# Patient Record
Sex: Female | Born: 1965 | Race: Black or African American | Hispanic: No | Smoking: Never smoker
Health system: Southern US, Community
[De-identification: ages and names within clinical notes are randomized; demographics above are authoritative.]

## PROBLEM LIST (undated history)

## (undated) DIAGNOSIS — B20 Human immunodeficiency virus [HIV] disease: Secondary | ICD-10-CM

## (undated) DIAGNOSIS — Z21 Asymptomatic human immunodeficiency virus [HIV] infection status: Secondary | ICD-10-CM

## (undated) HISTORY — DX: Asymptomatic human immunodeficiency virus (hiv) infection status: Z21

## (undated) HISTORY — DX: Human immunodeficiency virus (HIV) disease: B20

---

## 1990-08-17 DIAGNOSIS — R978 Other abnormal tumor markers: Secondary | ICD-10-CM | POA: Insufficient documentation

## 2001-09-17 DIAGNOSIS — F17200 Nicotine dependence, unspecified, uncomplicated: Secondary | ICD-10-CM | POA: Insufficient documentation

## 2005-09-07 ENCOUNTER — Emergency Department: Payer: Self-pay | Admitting: Emergency Medicine

## 2009-01-06 ENCOUNTER — Ambulatory Visit: Payer: Self-pay | Admitting: Rheumatology

## 2010-11-28 ENCOUNTER — Ambulatory Visit: Payer: Self-pay | Admitting: Family Medicine

## 2010-12-05 ENCOUNTER — Ambulatory Visit: Payer: Self-pay | Admitting: Family Medicine

## 2011-01-01 ENCOUNTER — Ambulatory Visit: Payer: Self-pay | Admitting: General Surgery

## 2011-01-03 LAB — PATHOLOGY REPORT

## 2011-09-10 ENCOUNTER — Ambulatory Visit: Payer: Self-pay | Admitting: General Surgery

## 2011-09-18 ENCOUNTER — Ambulatory Visit: Payer: Self-pay | Admitting: General Surgery

## 2013-05-19 ENCOUNTER — Ambulatory Visit: Payer: Self-pay | Admitting: Family Medicine

## 2013-08-11 DIAGNOSIS — M069 Rheumatoid arthritis, unspecified: Secondary | ICD-10-CM | POA: Insufficient documentation

## 2013-08-11 DIAGNOSIS — B20 Human immunodeficiency virus [HIV] disease: Secondary | ICD-10-CM | POA: Insufficient documentation

## 2013-08-11 DIAGNOSIS — Z1389 Encounter for screening for other disorder: Secondary | ICD-10-CM | POA: Insufficient documentation

## 2013-09-11 ENCOUNTER — Ambulatory Visit: Payer: Self-pay | Admitting: Family Medicine

## 2014-05-24 ENCOUNTER — Encounter: Payer: Self-pay | Admitting: Podiatry

## 2014-05-24 ENCOUNTER — Ambulatory Visit (INDEPENDENT_AMBULATORY_CARE_PROVIDER_SITE_OTHER): Payer: BC Managed Care – PPO

## 2014-05-24 ENCOUNTER — Ambulatory Visit (INDEPENDENT_AMBULATORY_CARE_PROVIDER_SITE_OTHER): Payer: BC Managed Care – PPO | Admitting: Podiatry

## 2014-05-24 VITALS — BP 117/67 | HR 60 | Resp 16 | Ht 63.0 in | Wt 172.0 lb

## 2014-05-24 DIAGNOSIS — S90129A Contusion of unspecified lesser toe(s) without damage to nail, initial encounter: Secondary | ICD-10-CM

## 2014-05-24 DIAGNOSIS — S90121A Contusion of right lesser toe(s) without damage to nail, initial encounter: Secondary | ICD-10-CM

## 2014-05-24 DIAGNOSIS — L600 Ingrowing nail: Secondary | ICD-10-CM

## 2014-05-24 MED ORDER — NEOMYCIN-POLYMYXIN-HC 3.5-10000-1 OT SOLN: OTIC | Status: DC

## 2014-05-24 NOTE — Patient Instructions (Signed)

## 2014-05-24 NOTE — Progress Notes (Signed)
   Subjective:    Patient ID: Alexandra Vaughan, female    DOB: Nov 23, 1965, 48 y.o.   MRN: [id number]  HPI Comments: i want the big toenail on my right foot taken off but i want it to grow back. i cracked it last week. Its been black and blue for a while. It does not hurt it unless i bump it on something. i keep hitting my toe. i pulled my cart at work over it yesterday. The nail is getting worse. i have pedicures on my toes. i only keep it clean and i trimmed it down some and i put band aids on my toe.      Review of Systems  HENT: Positive for sinus pressure.   Cardiovascular: Positive for leg swelling.  Hematological: Bruises/bleeds easily.  All other systems reviewed and are negative.      Objective:   Physical Exam: I have reviewed her past medical history medications allergies surgeries social history. She is HIV-positive. Pulses are palpable bilateral. Neurologic sensorium is intact per Semmes-Weinstein monofilament. Degenerative flexor intact bilateral muscle strength is 5 over 5 dorsiflexors plantar flexors inverters everters on his musculature is intact. Orthopedic evaluation demonstrates all joints distal to the ankle a full range of motion without crepitation. Cutaneous evaluation Mr. is supple well hydrated cutis there is no erythema edema cellulitis drainage or odor. She has thick yellow dystrophic clinically mycotic nail to the hallux right. This may be nail dystrophy. We are unsure at this point. But it is painful to touch.        Assessment & Plan:  Assessment: Nail dystrophy possible onychomycosis hallux right.  Plan: Total nail avulsion was performed today after 3 cc of a 50-50 mixture of Marcaine plain lidocaine plain was infiltrated in a hallux block right. He was totally avulsed and a dry sterile compressive dressing was applied. She was her soaking twice daily in Betadine and water starting tomorrow. The nail was avulsed and sent for pathologic evaluation.

## 2014-06-07 ENCOUNTER — Ambulatory Visit (INDEPENDENT_AMBULATORY_CARE_PROVIDER_SITE_OTHER): Payer: BC Managed Care – PPO | Admitting: Podiatry

## 2014-06-07 VITALS — BP 223/205 | HR 76 | Resp 16

## 2014-06-07 DIAGNOSIS — S90121A Contusion of right lesser toe(s) without damage to nail, initial encounter: Secondary | ICD-10-CM

## 2014-06-08 NOTE — Progress Notes (Signed)
She presents today for followup of a total nail avulsion hallux right. She denies fever chills nausea vomiting muscle aches and pains. States she's been soaking in vinegar and water.  Objective: Vital signs are stable she is alert and oriented x3. No erythema edema cellulitis drainage or odor.  Assessment: Well-healing nail avulsion hallux right.  Plan: Continue to soak but in Epsom salts in warm water until completely healed.

## 2016-03-23 DIAGNOSIS — Z6832 Body mass index (BMI) 32.0-32.9, adult: Secondary | ICD-10-CM | POA: Diagnosis not present

## 2016-03-23 DIAGNOSIS — B20 Human immunodeficiency virus [HIV] disease: Secondary | ICD-10-CM | POA: Diagnosis not present

## 2016-03-23 DIAGNOSIS — M069 Rheumatoid arthritis, unspecified: Secondary | ICD-10-CM | POA: Diagnosis not present

## 2016-07-02 DIAGNOSIS — R110 Nausea: Secondary | ICD-10-CM | POA: Diagnosis not present

## 2016-07-02 DIAGNOSIS — M7989 Other specified soft tissue disorders: Secondary | ICD-10-CM | POA: Diagnosis not present

## 2016-07-02 DIAGNOSIS — M25571 Pain in right ankle and joints of right foot: Secondary | ICD-10-CM | POA: Diagnosis not present

## 2016-07-02 DIAGNOSIS — Z6834 Body mass index (BMI) 34.0-34.9, adult: Secondary | ICD-10-CM | POA: Diagnosis not present

## 2016-07-02 DIAGNOSIS — M069 Rheumatoid arthritis, unspecified: Secondary | ICD-10-CM | POA: Diagnosis not present

## 2016-07-02 DIAGNOSIS — Z21 Asymptomatic human immunodeficiency virus [HIV] infection status: Secondary | ICD-10-CM | POA: Diagnosis not present

## 2016-07-02 DIAGNOSIS — M19031 Primary osteoarthritis, right wrist: Secondary | ICD-10-CM | POA: Diagnosis not present

## 2016-07-02 DIAGNOSIS — M25532 Pain in left wrist: Secondary | ICD-10-CM | POA: Diagnosis not present

## 2016-07-02 DIAGNOSIS — M0600 Rheumatoid arthritis without rheumatoid factor, unspecified site: Secondary | ICD-10-CM | POA: Diagnosis not present

## 2016-07-02 DIAGNOSIS — M25572 Pain in left ankle and joints of left foot: Secondary | ICD-10-CM | POA: Diagnosis not present

## 2016-07-02 DIAGNOSIS — Z7952 Long term (current) use of systemic steroids: Secondary | ICD-10-CM | POA: Diagnosis not present

## 2016-07-14 DIAGNOSIS — R030 Elevated blood-pressure reading, without diagnosis of hypertension: Secondary | ICD-10-CM | POA: Diagnosis not present

## 2016-07-14 DIAGNOSIS — Z6834 Body mass index (BMI) 34.0-34.9, adult: Secondary | ICD-10-CM | POA: Diagnosis not present

## 2016-08-02 DIAGNOSIS — E669 Obesity, unspecified: Secondary | ICD-10-CM | POA: Diagnosis not present

## 2016-08-02 DIAGNOSIS — Z6834 Body mass index (BMI) 34.0-34.9, adult: Secondary | ICD-10-CM | POA: Diagnosis not present

## 2016-08-21 DIAGNOSIS — D509 Iron deficiency anemia, unspecified: Secondary | ICD-10-CM | POA: Diagnosis not present

## 2016-08-21 DIAGNOSIS — Z6835 Body mass index (BMI) 35.0-35.9, adult: Secondary | ICD-10-CM | POA: Diagnosis not present

## 2016-08-21 DIAGNOSIS — B20 Human immunodeficiency virus [HIV] disease: Secondary | ICD-10-CM | POA: Diagnosis not present

## 2016-08-21 DIAGNOSIS — M069 Rheumatoid arthritis, unspecified: Secondary | ICD-10-CM | POA: Diagnosis not present

## 2016-10-08 DIAGNOSIS — M25531 Pain in right wrist: Secondary | ICD-10-CM | POA: Diagnosis not present

## 2016-10-08 DIAGNOSIS — D899 Disorder involving the immune mechanism, unspecified: Secondary | ICD-10-CM | POA: Diagnosis not present

## 2016-10-08 DIAGNOSIS — M25571 Pain in right ankle and joints of right foot: Secondary | ICD-10-CM | POA: Diagnosis not present

## 2016-10-08 DIAGNOSIS — Z79899 Other long term (current) drug therapy: Secondary | ICD-10-CM | POA: Diagnosis not present

## 2016-10-08 DIAGNOSIS — Z6836 Body mass index (BMI) 36.0-36.9, adult: Secondary | ICD-10-CM | POA: Diagnosis not present

## 2016-10-08 DIAGNOSIS — M0609 Rheumatoid arthritis without rheumatoid factor, multiple sites: Secondary | ICD-10-CM | POA: Diagnosis not present

## 2016-10-08 DIAGNOSIS — M25532 Pain in left wrist: Secondary | ICD-10-CM | POA: Diagnosis not present

## 2016-10-08 DIAGNOSIS — M25572 Pain in left ankle and joints of left foot: Secondary | ICD-10-CM | POA: Diagnosis not present

## 2016-10-08 DIAGNOSIS — M7989 Other specified soft tissue disorders: Secondary | ICD-10-CM | POA: Diagnosis not present

## 2016-10-08 DIAGNOSIS — Z21 Asymptomatic human immunodeficiency virus [HIV] infection status: Secondary | ICD-10-CM | POA: Diagnosis not present

## 2017-02-18 ENCOUNTER — Ambulatory Visit: Payer: Self-pay | Admitting: Podiatry

## 2017-02-18 ENCOUNTER — Encounter: Payer: Self-pay | Admitting: Podiatry

## 2017-02-18 ENCOUNTER — Ambulatory Visit (INDEPENDENT_AMBULATORY_CARE_PROVIDER_SITE_OTHER): Payer: BLUE CROSS/BLUE SHIELD | Admitting: Podiatry

## 2017-02-18 ENCOUNTER — Ambulatory Visit (INDEPENDENT_AMBULATORY_CARE_PROVIDER_SITE_OTHER): Payer: BLUE CROSS/BLUE SHIELD

## 2017-02-18 DIAGNOSIS — Z6836 Body mass index (BMI) 36.0-36.9, adult: Secondary | ICD-10-CM | POA: Diagnosis not present

## 2017-02-18 DIAGNOSIS — Z79899 Other long term (current) drug therapy: Secondary | ICD-10-CM | POA: Diagnosis not present

## 2017-02-18 DIAGNOSIS — M0600 Rheumatoid arthritis without rheumatoid factor, unspecified site: Secondary | ICD-10-CM | POA: Diagnosis not present

## 2017-02-18 DIAGNOSIS — M779 Enthesopathy, unspecified: Principal | ICD-10-CM

## 2017-02-18 DIAGNOSIS — M7750 Other enthesopathy of unspecified foot: Secondary | ICD-10-CM | POA: Diagnosis not present

## 2017-02-18 DIAGNOSIS — B20 Human immunodeficiency virus [HIV] disease: Secondary | ICD-10-CM | POA: Diagnosis not present

## 2017-02-18 DIAGNOSIS — R635 Abnormal weight gain: Secondary | ICD-10-CM | POA: Diagnosis not present

## 2017-02-18 DIAGNOSIS — L603 Nail dystrophy: Secondary | ICD-10-CM | POA: Diagnosis not present

## 2017-02-18 DIAGNOSIS — A499 Bacterial infection, unspecified: Secondary | ICD-10-CM | POA: Diagnosis not present

## 2017-02-18 DIAGNOSIS — M778 Other enthesopathies, not elsewhere classified: Secondary | ICD-10-CM

## 2017-02-18 DIAGNOSIS — L608 Other nail disorders: Secondary | ICD-10-CM | POA: Diagnosis not present

## 2017-02-18 NOTE — Progress Notes (Signed)
She presents today for a concern regarding her thick toenails bilateral.  Objective: Vital signs are stable she is alert and oriented 3 pulses are palpable neurologic sensorium is intact. Mallet toe deformity hammertoe deformities are noted. She has what appears to be subungual onychomycosis possible nail dystrophy.  Assessment: Nail dystrophy cannot rule out onychomycosis.  Plan: Samples of her hallux nails are taken today to be sent for pathologic evaluation when noted from her of the results once samples return.

## 2017-02-27 ENCOUNTER — Encounter: Payer: Self-pay | Admitting: Podiatry

## 2017-03-04 ENCOUNTER — Telehealth: Payer: Self-pay

## 2017-03-04 NOTE — Telephone Encounter (Signed)
Attempted to call pt to inform her of negative fungal culture results, line was continuously busy

## 2017-04-01 ENCOUNTER — Encounter: Payer: Self-pay | Admitting: Podiatry

## 2017-04-01 ENCOUNTER — Ambulatory Visit (INDEPENDENT_AMBULATORY_CARE_PROVIDER_SITE_OTHER): Payer: BLUE CROSS/BLUE SHIELD | Admitting: Podiatry

## 2017-04-01 DIAGNOSIS — L603 Nail dystrophy: Secondary | ICD-10-CM | POA: Diagnosis not present

## 2017-04-01 MED ORDER — NEOMYCIN-POLYMYXIN-HC 3.5-10000-1 OT SOLN: OTIC | 3 refills | Status: DC

## 2017-04-01 NOTE — Progress Notes (Signed)
She presents today for follow-up of her onychomycosis of the pathology report.  Objective: Pathology report states bacterial colonization no fungus T's or mold.  Assessment: Nail dystrophy.  Plan: Wrote a prescription for Cortisporin otic to be applied to the distal aspect of the free edge of the nail. Follow up with her as needed.

## 2017-04-09 DIAGNOSIS — Z1231 Encounter for screening mammogram for malignant neoplasm of breast: Secondary | ICD-10-CM | POA: Diagnosis not present

## 2017-04-09 DIAGNOSIS — N926 Irregular menstruation, unspecified: Secondary | ICD-10-CM | POA: Diagnosis not present

## 2017-04-09 DIAGNOSIS — B20 Human immunodeficiency virus [HIV] disease: Secondary | ICD-10-CM | POA: Diagnosis not present

## 2017-04-09 DIAGNOSIS — Z6836 Body mass index (BMI) 36.0-36.9, adult: Secondary | ICD-10-CM | POA: Diagnosis not present

## 2017-06-10 DIAGNOSIS — Z7952 Long term (current) use of systemic steroids: Secondary | ICD-10-CM | POA: Diagnosis not present

## 2017-06-10 DIAGNOSIS — M79641 Pain in right hand: Secondary | ICD-10-CM | POA: Diagnosis not present

## 2017-06-10 DIAGNOSIS — Z21 Asymptomatic human immunodeficiency virus [HIV] infection status: Secondary | ICD-10-CM | POA: Diagnosis not present

## 2017-06-10 DIAGNOSIS — Z23 Encounter for immunization: Secondary | ICD-10-CM | POA: Diagnosis not present

## 2017-06-10 DIAGNOSIS — M25532 Pain in left wrist: Secondary | ICD-10-CM | POA: Diagnosis not present

## 2017-06-10 DIAGNOSIS — Z6836 Body mass index (BMI) 36.0-36.9, adult: Secondary | ICD-10-CM | POA: Diagnosis not present

## 2017-06-10 DIAGNOSIS — M0579 Rheumatoid arthritis with rheumatoid factor of multiple sites without organ or systems involvement: Secondary | ICD-10-CM | POA: Diagnosis not present

## 2017-06-10 DIAGNOSIS — M779 Enthesopathy, unspecified: Secondary | ICD-10-CM | POA: Diagnosis not present

## 2017-06-10 DIAGNOSIS — Z79899 Other long term (current) drug therapy: Secondary | ICD-10-CM | POA: Diagnosis not present

## 2017-06-10 DIAGNOSIS — M25531 Pain in right wrist: Secondary | ICD-10-CM | POA: Diagnosis not present

## 2017-06-13 DIAGNOSIS — Z87891 Personal history of nicotine dependence: Secondary | ICD-10-CM | POA: Diagnosis not present

## 2017-06-13 DIAGNOSIS — R200 Anesthesia of skin: Secondary | ICD-10-CM | POA: Diagnosis not present

## 2017-06-13 DIAGNOSIS — M069 Rheumatoid arthritis, unspecified: Secondary | ICD-10-CM | POA: Diagnosis not present

## 2017-06-13 DIAGNOSIS — M79641 Pain in right hand: Secondary | ICD-10-CM | POA: Diagnosis not present

## 2017-06-13 DIAGNOSIS — G5600 Carpal tunnel syndrome, unspecified upper limb: Secondary | ICD-10-CM | POA: Diagnosis not present

## 2017-06-13 DIAGNOSIS — M79642 Pain in left hand: Secondary | ICD-10-CM | POA: Diagnosis not present

## 2017-06-13 DIAGNOSIS — R202 Paresthesia of skin: Secondary | ICD-10-CM | POA: Diagnosis not present

## 2017-06-13 DIAGNOSIS — M19041 Primary osteoarthritis, right hand: Secondary | ICD-10-CM | POA: Diagnosis not present

## 2017-06-13 DIAGNOSIS — Z9289 Personal history of other medical treatment: Secondary | ICD-10-CM | POA: Diagnosis not present

## 2017-06-13 DIAGNOSIS — M0579 Rheumatoid arthritis with rheumatoid factor of multiple sites without organ or systems involvement: Secondary | ICD-10-CM | POA: Diagnosis not present

## 2017-06-13 DIAGNOSIS — G5603 Carpal tunnel syndrome, bilateral upper limbs: Secondary | ICD-10-CM | POA: Diagnosis not present

## 2017-07-02 DIAGNOSIS — G5602 Carpal tunnel syndrome, left upper limb: Secondary | ICD-10-CM | POA: Diagnosis not present

## 2017-07-02 DIAGNOSIS — Z1231 Encounter for screening mammogram for malignant neoplasm of breast: Secondary | ICD-10-CM | POA: Diagnosis not present

## 2017-07-02 DIAGNOSIS — M79642 Pain in left hand: Secondary | ICD-10-CM | POA: Diagnosis not present

## 2017-07-02 DIAGNOSIS — M79641 Pain in right hand: Secondary | ICD-10-CM | POA: Diagnosis not present

## 2017-07-15 DIAGNOSIS — R928 Other abnormal and inconclusive findings on diagnostic imaging of breast: Secondary | ICD-10-CM | POA: Diagnosis not present

## 2017-07-15 DIAGNOSIS — M19031 Primary osteoarthritis, right wrist: Secondary | ICD-10-CM | POA: Diagnosis not present

## 2017-08-06 DIAGNOSIS — M19031 Primary osteoarthritis, right wrist: Secondary | ICD-10-CM | POA: Diagnosis not present

## 2017-08-06 DIAGNOSIS — Z79899 Other long term (current) drug therapy: Secondary | ICD-10-CM | POA: Diagnosis not present

## 2017-08-06 DIAGNOSIS — M069 Rheumatoid arthritis, unspecified: Secondary | ICD-10-CM | POA: Diagnosis not present

## 2017-08-06 DIAGNOSIS — Z87891 Personal history of nicotine dependence: Secondary | ICD-10-CM | POA: Diagnosis not present

## 2017-08-06 DIAGNOSIS — G5601 Carpal tunnel syndrome, right upper limb: Secondary | ICD-10-CM | POA: Diagnosis not present

## 2017-08-06 DIAGNOSIS — G8918 Other acute postprocedural pain: Secondary | ICD-10-CM | POA: Diagnosis not present

## 2017-08-06 DIAGNOSIS — Z21 Asymptomatic human immunodeficiency virus [HIV] infection status: Secondary | ICD-10-CM | POA: Diagnosis not present

## 2017-08-06 HISTORY — PX: JOINT REPLACEMENT: SHX530

## 2017-08-19 DIAGNOSIS — Z9889 Other specified postprocedural states: Secondary | ICD-10-CM | POA: Diagnosis not present

## 2017-08-19 DIAGNOSIS — M7989 Other specified soft tissue disorders: Secondary | ICD-10-CM | POA: Diagnosis not present

## 2017-09-10 DIAGNOSIS — Z6838 Body mass index (BMI) 38.0-38.9, adult: Secondary | ICD-10-CM | POA: Diagnosis not present

## 2017-09-10 DIAGNOSIS — Z113 Encounter for screening for infections with a predominantly sexual mode of transmission: Secondary | ICD-10-CM | POA: Diagnosis not present

## 2017-09-10 DIAGNOSIS — B20 Human immunodeficiency virus [HIV] disease: Secondary | ICD-10-CM | POA: Diagnosis not present

## 2017-09-10 DIAGNOSIS — Z1211 Encounter for screening for malignant neoplasm of colon: Secondary | ICD-10-CM | POA: Diagnosis not present

## 2017-09-16 ENCOUNTER — Emergency Department
Admission: EM | Admit: 2017-09-16 | Discharge: 2017-09-17 | Disposition: A | Discharge status: Home / Self Care | Payer: BLUE CROSS/BLUE SHIELD | Attending: Emergency Medicine | Admitting: Emergency Medicine

## 2017-09-16 ENCOUNTER — Emergency Department: Payer: BLUE CROSS/BLUE SHIELD

## 2017-09-16 DIAGNOSIS — M069 Rheumatoid arthritis, unspecified: Secondary | ICD-10-CM | POA: Diagnosis not present

## 2017-09-16 DIAGNOSIS — Z79899 Other long term (current) drug therapy: Secondary | ICD-10-CM | POA: Insufficient documentation

## 2017-09-16 DIAGNOSIS — R1032 Left lower quadrant pain: Secondary | ICD-10-CM | POA: Insufficient documentation

## 2017-09-16 DIAGNOSIS — B20 Human immunodeficiency virus [HIV] disease: Secondary | ICD-10-CM | POA: Insufficient documentation

## 2017-09-16 DIAGNOSIS — Z96649 Presence of unspecified artificial hip joint: Secondary | ICD-10-CM | POA: Insufficient documentation

## 2017-09-16 DIAGNOSIS — M19031 Primary osteoarthritis, right wrist: Secondary | ICD-10-CM | POA: Diagnosis not present

## 2017-09-16 DIAGNOSIS — Z981 Arthrodesis status: Secondary | ICD-10-CM | POA: Diagnosis not present

## 2017-09-16 DIAGNOSIS — R109 Unspecified abdominal pain: Secondary | ICD-10-CM | POA: Diagnosis not present

## 2017-09-16 LAB — COMPREHENSIVE METABOLIC PANEL
ALBUMIN: 3.7 g/dL (ref 3.5–5.0)
ALT: 17 U/L (ref 14–54)
AST: 27 U/L (ref 15–41)
Alkaline Phosphatase: 59 U/L (ref 38–126)
Anion gap: 7 (ref 5–15)
BILIRUBIN TOTAL: 0.6 mg/dL (ref 0.3–1.2)
BUN: 12 mg/dL (ref 6–20)
CHLORIDE: 104 mmol/L (ref 101–111)
CO2: 25 mmol/L (ref 22–32)
CREATININE: 0.87 mg/dL (ref 0.44–1.00)
Calcium: 8.9 mg/dL (ref 8.9–10.3)
GFR calc Af Amer: >60 mL/min (ref >60)
GLUCOSE: 81 mg/dL (ref 65–99)
Potassium: 4.5 mmol/L (ref 3.5–5.1)
Sodium: 136 mmol/L (ref 135–145)
TOTAL PROTEIN: 7.9 g/dL (ref 6.5–8.1)

## 2017-09-16 LAB — LIPASE, BLOOD: Lipase: 31 U/L (ref 11–51)

## 2017-09-16 LAB — URINALYSIS, COMPLETE (UACMP) WITH MICROSCOPIC
BACTERIA UA: NONE SEEN
Bilirubin Urine: NEGATIVE
GLUCOSE, UA: NEGATIVE mg/dL
HGB URINE DIPSTICK: NEGATIVE
KETONES UR: NEGATIVE mg/dL
LEUKOCYTES UA: NEGATIVE
Nitrite: NEGATIVE
PROTEIN: NEGATIVE mg/dL
Specific Gravity, Urine: 1.018 (ref 1.005–1.030)
pH: 6 (ref 5.0–8.0)

## 2017-09-16 LAB — CBC
HEMATOCRIT: 34.2 % — AB (ref 35.0–47.0)
Hemoglobin: 11.5 g/dL — ABNORMAL LOW (ref 12.0–16.0)
MCH: 29.9 pg (ref 26.0–34.0)
MCHC: 33.6 g/dL (ref 32.0–36.0)
MCV: 88.8 fL (ref 80.0–100.0)
PLATELETS: 220 10*3/uL (ref 150–440)
RBC: 3.85 MIL/uL (ref 3.80–5.20)
RDW: 15.3 % — ABNORMAL HIGH (ref 11.5–14.5)
WBC: 7.1 10*3/uL (ref 3.6–11.0)

## 2017-09-16 MED ORDER — ONDANSETRON HCL 4 MG/2ML IJ SOLN
4.0000 mg | Freq: Once | INTRAMUSCULAR | Status: AC
  Administered 2017-09-16: 4 mg via INTRAVENOUS

## 2017-09-16 MED ORDER — SODIUM CHLORIDE 0.9 % IV BOLUS (SEPSIS)
500.0000 mL | Freq: Once | INTRAVENOUS | Status: AC
  Administered 2017-09-16: 500 mL via INTRAVENOUS

## 2017-09-16 MED ORDER — FENTANYL CITRATE (PF) 100 MCG/2ML IJ SOLN: 50.0000 ug | INTRAMUSCULAR | Status: DC | PRN

## 2017-09-16 MED ORDER — MORPHINE SULFATE (PF) 4 MG/ML IV SOLN
4.0000 mg | Freq: Once | INTRAVENOUS | Status: AC
  Administered 2017-09-16: 4 mg via INTRAVENOUS

## 2017-09-16 MED ORDER — ONDANSETRON HCL 4 MG/2ML IJ SOLN
INTRAMUSCULAR | Status: AC
  Administered 2017-09-16: 4 mg via INTRAVENOUS
  Filled 2017-09-16: qty 2

## 2017-09-16 MED ORDER — MORPHINE SULFATE (PF) 4 MG/ML IV SOLN
INTRAVENOUS | Status: AC
  Administered 2017-09-16: 4 mg via INTRAVENOUS
  Filled 2017-09-16: qty 1

## 2017-09-16 NOTE — ED Provider Notes (Signed)
[facility] Emergency Department Provider Note  ____________________________________________  Time seen: Approximately 10:53 PM  I have reviewed the triage vital signs and the nursing notes.   HISTORY  Chief Complaint Abdominal Pain   HPI Alexandra Vaughan is a 52 y.o. female the history of HIV (CD4 count of 873, undetectable viral load on 09/11/17) who presents for evaluation of abdominal pain. Patient reports sudden onset of severe left-sided abdominal pain, sharp, coming in waves, constant for several hours. She has nausea but no vomiting, no diarrhea or constipation, no dysuria or hematuria. Patient reports having a similar episode several years ago with a negative workup. She denies any prior history of kidney stones. No fever or chills. No URI symptoms. No chest pain or shortness of breath.  Past Medical History:  Diagnosis Date  . HIV (human immunodeficiency virus infection) [facility])     Patient Active Problem List   Diagnosis Date Noted  . Obesity 08/02/2016  . Multiphasic screening 08/11/2013  . HIV disease (HCC) 08/11/2013  . Rheumatoid arthritis (HCC) 08/11/2013  . Tobacco use disorder 09/17/2001  . Abnormal tumor markers 08/17/1990    Past Surgical History:  Procedure Laterality Date  . JOINT REPLACEMENT      Prior to Admission medications   Medication Sig Start Date End Date Taking? Authorizing Provider  atazanavir (REYATAZ) 300 MG capsule Take 300 mg by mouth. 09/08/13   [provider]  Calcium Carb-Cholecalciferol 600-800 MG-UNIT CHEW Chew by mouth. 03/02/13   [provider]  darunavir (PREZISTA) 800 MG tablet Take 800 mg by mouth. 08/21/16   [provider]  emtricitabine-tenofovir (TRUVADA) 200-300 MG per tablet Take by mouth. 09/08/13   [provider]  emtricitabine-tenofovir AF (DESCOVY) 200-25 MG tablet Take by mouth. 08/21/16   [provider]  etanercept (ENBREL) 50 MG/ML injection  Inject 50 mg into the skin. 07/05/16   [provider]  Ferrous Fumarate (FERROCITE) 324 (106 Fe) MG TABS tablet TAKE 1 TABLET BY MOUTH EVERY DAY AT 6:00AM 07/11/15   [provider]  hydroxychloroquine (PLAQUENIL) 200 MG tablet TAKE 1 TABLET BY MOUTH TWICE A DAY 12/03/13   [provider]  neomycin-polymyxin-hydrocortisone (CORTISPORIN) otic solution Apply one to two drops to affected toe(s) twice daily after soaking 05/24/14   Hyatt, Max T, DPM  neomycin-polymyxin-hydrocortisone (CORTISPORIN) OTIC solution Apply one drop to all affected nails along the free edge of the nail. 04/01/17   Hyatt, Max T, DPM  predniSONE (DELTASONE) 10 MG tablet Take 10 mg by mouth. 09/08/13   [provider]  ritonavir (NORVIR) 100 MG TABS tablet Take 100 mg by mouth. 09/08/13   [provider]    Allergies Percocet [oxycodone-acetaminophen] and Ciprofloxacin  No family history on file.  Social History Social History   Tobacco Use  . Smoking status: Never Smoker  . Smokeless tobacco: Never Used  Substance Use Topics  . Alcohol use: No  . Drug use: Not on file    Review of Systems  Constitutional: Negative for fever. Eyes: Negative for visual changes. ENT: Negative for sore throat. Neck: No neck pain  Cardiovascular: Negative for chest pain. Respiratory: Negative for shortness of breath. Gastrointestinal: + L sided abdominal pain and nausea. No vomiting or diarrhea. Genitourinary: Negative for dysuria. Musculoskeletal: Negative for back pain. Skin: Negative for rash. Neurological: Negative for headaches, weakness or numbness. Psych: No SI or HI  ____________________________________________   PHYSICAL EXAM:  VITAL SIGNS: ED Triage Vitals  Enc Vitals Group  BP 09/16/17 2147 (!) 144/70     Pulse Rate 09/16/17 2147 71     Resp 09/16/17 2147 (!) 26     Temp 09/16/17 2147 98.8 F (37.1 C)     Temp Source 09/16/17 2147 Oral     SpO2 09/16/17 2147 100 %       Weight 09/16/17 2148 208 lb (94.3 kg)     Height --      Head Circumference --      Peak Flow --      Pain Score 09/16/17 2147 10     Pain Loc --      Pain Edu? --      Excl. in GC? --     Constitutional: Alert and oriented, in distress due to pain.  HEENT:      Head: Normocephalic and atraumatic.         Eyes: Conjunctivae are normal. Sclera is non-icteric.       Mouth/Throat: Mucous membranes are moist.       Neck: Supple with no signs of meningismus. Cardiovascular: Regular rate and rhythm. No murmurs, gallops, or rubs. 2+ symmetrical distal pulses are present in all extremities. No JVD. Respiratory: Normal respiratory effort. Lungs are clear to auscultation bilaterally. No wheezes, crackles, or rhonchi.  Gastrointestinal: Soft, tenderness to palpation on the left lower quadrant and suprapubic region, non distended with positive bowel sounds. No rebound or guarding. Genitourinary: No CVA tenderness. Musculoskeletal: Nontender with normal range of motion in all extremities. No edema, cyanosis, or erythema of extremities. Neurologic: Normal speech and language. Face is symmetric. Moving all extremities. No gross focal neurologic deficits are appreciated. Skin: Skin is warm, dry and intact. No rash noted. Psychiatric: Mood and affect are normal. Speech and behavior are normal.  ____________________________________________   LABS (all labs ordered are listed, but only abnormal results are displayed)  Labs Reviewed  CBC - Abnormal; Notable for the following components:      Result Value   Hemoglobin 11.5 (*)    HCT 34.2 (*)    RDW 15.3 (*)    All other components within normal limits  LIPASE, BLOOD  COMPREHENSIVE METABOLIC PANEL  URINALYSIS, COMPLETE (UACMP) WITH MICROSCOPIC  HCG, QUANTITATIVE, PREGNANCY  POC URINE PREG, ED   ____________________________________________  EKG  none  ____________________________________________  RADIOLOGY  CT renal: PND   ____________________________________________   PROCEDURES  Procedure(s) performed: None Procedures Critical Care performed:  None ____________________________________________   INITIAL IMPRESSION / ASSESSMENT AND PLAN / ED COURSE  52 y.o. female the history of HIV (CD4 count of 873, undetectable viral load on 09/11/17) who presents for evaluation of sudden onset of severe L sided abdominal pain. Patient is in distress due to pain, vitals are within normal limits, she is tender to palpation on the left lower quadrant and suprapubic regions with no rebound or guarding, no CVA tenderness. Differential diagnoses including kidney stone versus peptic ulcer disease versus diverticulitis versus pyelonephritis versus gastritis versus volvulus versus pancreatitis. Labs are pending. Patient will be given morphine, Zofran and fluids. We'll send her for CT abdomen and pelvis without contrast.  Clinical Course as of Sep 17 2347  Mon Sep 16, 2017  2348 CT pending. Labs with no acute findings. UA pending. Care transferred to Dr. Manson Passey. Plan to f/u UA, CT and reassess.  [CV]    Clinical Course User Index [CV] Don Perking Washington, MD     As part of my medical decision making, I reviewed the following data within the  electronic MEDICAL RECORD NUMBER Nursing notes reviewed and incorporated, Labs reviewed , Patient signed out to Dr. Manson Passey, Notes from prior ED visits and [place] Controlled Substance Database    Pertinent labs & imaging results that were available during my care of the patient were reviewed by me and considered in my medical decision making (see chart for details).    ____________________________________________   FINAL CLINICAL IMPRESSION(S) / ED DIAGNOSES  Final diagnoses:  Left lower quadrant pain      NEW MEDICATIONS STARTED DURING THIS VISIT:  ED Discharge Orders    None       Note:  This document was prepared using Dragon voice recognition software and may include  unintentional dictation errors.    Nita Sickle, MD 09/16/17 [phone]

## 2017-09-16 NOTE — ED Triage Notes (Signed)
Patient c/o lower abdominal pain rated 10 of 10. Patient reports nausea, denies vomiting and diarrhea.

## 2017-09-17 ENCOUNTER — Emergency Department: Payer: BLUE CROSS/BLUE SHIELD

## 2017-09-17 DIAGNOSIS — R109 Unspecified abdominal pain: Secondary | ICD-10-CM | POA: Diagnosis not present

## 2017-09-17 LAB — HCG, QUANTITATIVE, PREGNANCY: hCG, Beta Chain, Quant, S: 1 m[IU]/mL (ref <5)

## 2017-09-17 MED ORDER — TRAMADOL HCL 50 MG PO TABS
100.0000 mg | ORAL_TABLET | Freq: Once | ORAL | Status: AC
  Administered 2017-09-17: 100 mg via ORAL
  Filled 2017-09-17: qty 2

## 2017-09-17 MED ORDER — TRAMADOL HCL 50 MG PO TABS: 50.0000 mg | ORAL_TABLET | Freq: Four times a day (QID) | ORAL | 0 refills | Status: AC | PRN

## 2017-09-17 NOTE — ED Provider Notes (Signed)
I assumed care of the patient from Dr. Michell Heinrich at 12:00 AM with recommendation to follow-up with CT scan of the abdomen renal study which revealed no evidence of urolithiasis possible uterine fibroid noted.  Patient advised to follow-up with OB/GYN   Darci Current, MD 09/17/17 [phone]

## 2017-09-20 ENCOUNTER — Encounter: Payer: Self-pay | Admitting: Obstetrics and Gynecology

## 2017-09-20 ENCOUNTER — Ambulatory Visit (INDEPENDENT_AMBULATORY_CARE_PROVIDER_SITE_OTHER): Payer: BLUE CROSS/BLUE SHIELD | Admitting: Obstetrics and Gynecology

## 2017-09-20 VITALS — BP 122/66 | HR 59 | Ht 63.0 in | Wt 221.0 lb

## 2017-09-20 DIAGNOSIS — Z113 Encounter for screening for infections with a predominantly sexual mode of transmission: Secondary | ICD-10-CM | POA: Diagnosis not present

## 2017-09-20 DIAGNOSIS — Z124 Encounter for screening for malignant neoplasm of cervix: Secondary | ICD-10-CM

## 2017-09-20 DIAGNOSIS — R102 Pelvic and perineal pain: Secondary | ICD-10-CM

## 2017-09-20 DIAGNOSIS — R978 Other abnormal tumor markers: Secondary | ICD-10-CM

## 2017-09-20 NOTE — Patient Instructions (Signed)
Pelvic Pain, Female Pelvic pain is pain in your lower abdomen, below your belly button and between your hips. The pain may start suddenly (acute), keep coming back (recurring), or last a long time (chronic). Pelvic pain that lasts longer than six months is considered chronic. Pelvic pain may affect your:  Reproductive organs.  Urinary system.  Digestive tract.  Musculoskeletal system.  There are many potential causes of pelvic pain. Sometimes, the pain can be a result of digestive or urinary conditions, strained muscles or ligaments, or even reproductive conditions. Sometimes the cause of pelvic pain is not known. Follow these instructions at home:  Take over-the-counter and prescription medicines only as told by your health care provider.  Rest as told by your health care provider.  Do not have sex it if hurts.  Keep a journal of your pelvic pain. Write down: ? When the pain started. ? Where the pain is located. ? What seems to make the pain better or worse, such as food or your menstrual cycle. ? Any symptoms you have along with the pain.  Keep all follow-up visits as told by your health care provider. This is important. Contact a health care provider if:  Medicine does not help your pain.  Your pain comes back.  You have new symptoms.  You have abnormal vaginal discharge or bleeding, including bleeding after menopause.  You have a fever or chills.  You are constipated.  You have blood in your urine or stool.  You have foul-smelling urine.  You feel weak or lightheaded. Get help right away if:  You have sudden severe pain.  Your pain gets steadily worse.  You have severe pain along with fever, nausea, vomiting, or excessive sweating.  You lose consciousness. This information is not intended to replace advice given to you by your health care provider. Make sure you discuss any questions you have with your health care provider. Document Released: 07/17/2004  Document Revised: 09/14/2015 Document Reviewed: 06/10/2015 Elsevier Interactive Patient Education  2018 [employer]vier Inc[employer]  

## 2017-09-20 NOTE — Progress Notes (Signed)
Gynecology Pelvic Pain Evaluation   Chief Complaint:  Chief Complaint  Patient presents with  . ER follow up    Lower abdominal pain    History of Present Illness:   Patient is a 52 y.o. G4P4 who LMP was Patient's last menstrual period was 08/06/2017 (exact date)., presents today for a problem visit.  She complains of midline pelvic pain.  On initial presentation more prominent on left.   Her pain is localized to the suprapubic and deep pelvis area, described as constant, began 3 days ago and its severity is described as initially severe and acute onset but has steadily improved since ER presentation on 09/16/17.. The pain radiates to the  Non-radiating. She has these associated symptoms which include none. Patient has these modifiers which include pain medication and lying down that make it better and movement that make it worse.  Context includes: no inciting event noted by the patient.  She skipped her last menses.  Previously reported regular monthly menses lasting 4-5 days, not particularly heavy, no prior issues with dysmenorrhea.  She is not and has not been sexually active in sometime so unable to relate any dyspareunia.  Denies vaginal discharge.  She does have a history of HIV diagnosed early 33's, states viral load has been undetectable.  No recent changes in medications.  Previous evaluation: normal CT imaging in ER, small 83mm fibroid. Prior Diagnosis: fibroid   Review of Systems: Review of Systems  Constitutional: Negative for chills and fever.  Gastrointestinal: Positive for abdominal pain. Negative for blood in stool, constipation, diarrhea, melena, nausea and vomiting.  Genitourinary: Negative for dysuria, frequency, hematuria and urgency.  Skin: Negative.   Neurological: Negative for dizziness and headaches.    Past Medical History:  Past Medical History:  Diagnosis Date  . HIV (human immunodeficiency virus infection) (HCC)     Past Surgical History:  Past  Surgical History:  Procedure Laterality Date  . JOINT REPLACEMENT  08/06/2017    Gynecologic History:  Patient's last menstrual period was 08/06/2017 (exact date). She is not sexually active.  Contraception: none. Hx of STDs: HIV. History of domestic or sexual abuse:  no Last Pap:>1 year ago  Obstetric History: G71P4  Family History:  Family History  Problem Relation Age of Onset  . Breast cancer Maternal Aunt   . Breast cancer Maternal Grandmother     Social History:  Social History   Socioeconomic History  . Marital status: Widowed    Spouse name: Not on file  . Number of children: Not on file  . Years of education: Not on file  . Highest education level: Not on file  Social Needs  . Financial resource strain: Not on file  . Food insecurity - worry: Not on file  . Food insecurity - inability: Not on file  . Transportation needs - medical: Not on file  . Transportation needs - non-medical: Not on file  Occupational History  . Not on file  Tobacco Use  . Smoking status: Never Smoker  . Smokeless tobacco: Never Used  Substance and Sexual Activity  . Alcohol use: No  . Drug use: No  . Sexual activity: Not Currently    Birth control/protection: None  Other Topics Concern  . Not on file  Social History Narrative  . Not on file    Allergies:  Allergies  Allergen Reactions  . Percocet [Oxycodone-Acetaminophen] Nausea Only  . Ciprofloxacin Palpitations    Medications: Prior to Admission medications   Medication  Sig Start Date End Date Taking? Authorizing Provider  Adalimumab 40 MG/0.8ML PNKT Inject 40 mg into the skin. 09/10/17  Yes [provider]  darunavir (PREZISTA) 800 MG tablet Take 800 mg by mouth. 08/21/16  Yes [provider]  emtricitabine-tenofovir AF (DESCOVY) 200-25 MG tablet Take by mouth. 08/21/16  Yes [provider]  neomycin-polymyxin-hydrocortisone (CORTISPORIN) OTIC solution Apply one drop to all affected nails  along the free edge of the nail. 04/01/17  Yes Hyatt, Max T, DPM  ritonavir (NORVIR) 100 MG TABS tablet Take 100 mg by mouth. 09/08/13  Yes [provider]  traMADol (ULTRAM) 50 MG tablet Take 1 tablet (50 mg total) by mouth every 6 (six) hours as needed. 09/17/17 09/17/18 Yes Darci Current, MD  triamcinolone cream (KENALOG) 0.1 % APPLY TO AFFECTED AREA TWICE A DAY 07/19/17  Yes [provider]  atazanavir (REYATAZ) 300 MG capsule Take 300 mg by mouth. 09/08/13   [provider]  Calcium Carb-Cholecalciferol 600-800 MG-UNIT CHEW Chew by mouth. 03/02/13   [provider]  emtricitabine-tenofovir (TRUVADA) 200-300 MG per tablet Take by mouth. 09/08/13   [provider]  etanercept (ENBREL) 50 MG/ML injection Inject 50 mg into the skin. 07/05/16   [provider]  Ferrous Fumarate (FERROCITE) 324 (106 Fe) MG TABS tablet TAKE 1 TABLET BY MOUTH EVERY DAY AT 6:00AM 07/11/15   [provider]  hydroxychloroquine (PLAQUENIL) 200 MG tablet TAKE 1 TABLET BY MOUTH TWICE A DAY 12/03/13   [provider]  neomycin-polymyxin-hydrocortisone (CORTISPORIN) otic solution Apply one to two drops to affected toe(s) twice daily after soaking Patient not taking: Reported on 09/20/2017 05/24/14   Hyatt, Max T, DPM  predniSONE (DELTASONE) 10 MG tablet Take 10 mg by mouth. 09/08/13   [provider]    Physical Exam Vitals: Blood pressure 122/66, pulse (!) 59, height 5\' 3"  (1.6 m), weight 221 lb (100.2 kg), last menstrual period 08/06/2017.  General: NAD HEENT: normocephalic, anicteric Pulmonary: No increased work of breathing Abdomen: NABS, soft, non-tender, non-distended.  Umbilicus without lesions.  No hepatomegaly, splenomegaly or masses palpable. No evidence of hernia  Genitourinary:  External: Normal external female genitalia.  Normal urethral meatus, normal  Bartholin's and Skene's glands.    Vagina: Normal vaginal mucosa, no evidence of  prolapse.    Cervix: Grossly normal in appearance, no bleeding  Uterus: Non-enlarged, mobile, normal contour.  No CMT  Adnexa: ovaries non-enlarged, no adnexal masses  Rectal: deferred  Lymphatic: no evidence of inguinal lymphadenopathy Extremities: no edema, erythema, or tenderness Neurologic: Grossly intact Psychiatric: mood appropriate, affect full  Female chaperone present for pelvic portion of the physical   Ct Renal Stone Study  Result Date: 09/17/2017 CLINICAL DATA:  52 year old female with abdominal pain. Sudden onset left-sided pain. EXAM: CT ABDOMEN AND PELVIS WITHOUT CONTRAST TECHNIQUE: Multidetector CT imaging of the abdomen and pelvis was performed following the standard protocol without IV contrast. COMPARISON:  Abdominal CT dated 05/19/2013 FINDINGS: Evaluation of this exam is limited in the absence of intravenous contrast. Lower chest: The visualized lung bases are clear. There is hypoattenuation of the cardiac blood pool suggestive of a degree of anemia. Clinical correlation is recommended. No intra-abdominal free air or free fluid. Hepatobiliary: The liver is unremarkable. The gallbladder is contracted. Pancreas: Unremarkable. No pancreatic ductal dilatation or surrounding inflammatory changes. Spleen: Normal in size without focal abnormality. Adrenals/Urinary Tract: Adrenal glands are unremarkable. Kidneys are normal, without renal calculi, focal lesion, or hydronephrosis. Bladder is unremarkable. Stomach/Bowel:  Stomach is within normal limits. Appendix appears normal. No evidence of bowel wall thickening, distention, or inflammatory changes. Vascular/Lymphatic: Mild atherosclerotic calcification of the aorta. The abdominal aorta and IVC are otherwise grossly unremarkable on this noncontrast CT. No portal venous gas. There is no adenopathy. Reproductive: The uterus is anteverted. Faint 12 mm low attenuating lesion in the posterior myometrium (sagittal series 6, image 117) is not well  characterized but may represent a fibroid. The ovaries are grossly unremarkable as visualized. Other: Small fat containing umbilical hernia. Musculoskeletal: No acute or significant osseous findings. IMPRESSION: 1. No acute intra-abdominal or pelvic pathology. No bowel obstruction or active inflammation. Normal appendix. 2. No hydronephrosis or nephrolithiasis. 3.  Aortic Atherosclerosis (ICD10-I70.0). Electronically Signed   By: Elgie Collard M.D.   On: 09/17/2017 02:24   Assessment: 52 y.o. G4P4 with acute onset pelvic pain  Problem List Items Addressed This Visit      Other   Abnormal tumor markers    Other Visit Diagnoses    Pelvic pain in female    -  Primary   Relevant Orders   PapIG, CtNgTv, HPV, rfx 16/18   Cervical cancer screening       Relevant Orders   PapIG, CtNgTv, HPV, rfx 16/18   Screen for STD (sexually transmitted disease)       Relevant Orders   PapIG, CtNgTv, HPV, rfx 16/18       We discussed the possible etiologies for pelvic pain in women.  Gynecologic causes may include endometriosis, adenomyosis, pelvic inflammatory disease (PID), ovarian cysts, ovarian or tubal torsion, and in rare case gynecologic malignancy such as cervical, uterine, or ovarian cancer.  In addition thee possibility of non-gynecologic etiologies such as urinary or GI tract pathology or disordered, as well as musculoskeletal problems.  The goal is to complete a basic work up in hopes of identifying the underlying cause which in turn will dictate treatment.  In the meantime supportive measures such as localized heat, and NSAIDs are reasonable first steps.     - Prescription drug database was not reviewed, UDS was not ordered - Transvaginal ultrasound not ordered but prior imaging CT/MRI reviewed, a small 32mm uterine fibroid should not account for the patient's symptoms - Blood work obtained today No, ER labs reviewed - Cervical cultures Yes with pap - UA reviewed  - Improved since ER  expectant management at present if worsens follow up sooner.  Also no menses for last 2 months, no vasomotor symptoms.  If not resolved we discussed trial of norethindrone vs diagnostic laparoscopy.  Overall I have low suspicion for a gyn etiology such as endometriosis or PID  Return in about 6 weeks (around 11/01/2017) for F/U pelvic pain.

## 2017-09-24 LAB — PAPIG, CTNGTV, HPV, RFX 16/18
Chlamydia, Nuc. Acid Amp: NEGATIVE
Gonococcus, Nuc. Acid Amp: NEGATIVE
HPV, HIGH-RISK: NEGATIVE
PAP SMEAR COMMENT: 0
TRICH VAG BY NAA: NEGATIVE

## 2017-10-03 DIAGNOSIS — M19031 Primary osteoarthritis, right wrist: Secondary | ICD-10-CM | POA: Diagnosis not present

## 2017-10-03 DIAGNOSIS — R200 Anesthesia of skin: Secondary | ICD-10-CM | POA: Diagnosis not present

## 2017-10-03 DIAGNOSIS — Z981 Arthrodesis status: Secondary | ICD-10-CM | POA: Diagnosis not present

## 2017-10-03 DIAGNOSIS — Z4789 Encounter for other orthopedic aftercare: Secondary | ICD-10-CM | POA: Diagnosis not present

## 2017-10-03 DIAGNOSIS — M25639 Stiffness of unspecified wrist, not elsewhere classified: Secondary | ICD-10-CM | POA: Diagnosis not present

## 2017-10-03 DIAGNOSIS — R202 Paresthesia of skin: Secondary | ICD-10-CM | POA: Diagnosis not present

## 2017-10-10 DIAGNOSIS — R202 Paresthesia of skin: Secondary | ICD-10-CM | POA: Diagnosis not present

## 2017-10-10 DIAGNOSIS — Z4789 Encounter for other orthopedic aftercare: Secondary | ICD-10-CM | POA: Diagnosis not present

## 2017-10-10 DIAGNOSIS — M19031 Primary osteoarthritis, right wrist: Secondary | ICD-10-CM | POA: Diagnosis not present

## 2017-10-10 DIAGNOSIS — R200 Anesthesia of skin: Secondary | ICD-10-CM | POA: Diagnosis not present

## 2017-10-10 DIAGNOSIS — Z981 Arthrodesis status: Secondary | ICD-10-CM | POA: Diagnosis not present

## 2017-10-15 DIAGNOSIS — R200 Anesthesia of skin: Secondary | ICD-10-CM | POA: Diagnosis not present

## 2017-10-15 DIAGNOSIS — M19031 Primary osteoarthritis, right wrist: Secondary | ICD-10-CM | POA: Diagnosis not present

## 2017-10-15 DIAGNOSIS — Z981 Arthrodesis status: Secondary | ICD-10-CM | POA: Diagnosis not present

## 2017-10-15 DIAGNOSIS — Z4789 Encounter for other orthopedic aftercare: Secondary | ICD-10-CM | POA: Diagnosis not present

## 2017-10-15 DIAGNOSIS — R202 Paresthesia of skin: Secondary | ICD-10-CM | POA: Diagnosis not present

## 2017-10-17 DIAGNOSIS — R202 Paresthesia of skin: Secondary | ICD-10-CM | POA: Diagnosis not present

## 2017-10-17 DIAGNOSIS — Z4789 Encounter for other orthopedic aftercare: Secondary | ICD-10-CM | POA: Diagnosis not present

## 2017-10-17 DIAGNOSIS — M19031 Primary osteoarthritis, right wrist: Secondary | ICD-10-CM | POA: Diagnosis not present

## 2017-10-17 DIAGNOSIS — R200 Anesthesia of skin: Secondary | ICD-10-CM | POA: Diagnosis not present

## 2017-10-17 DIAGNOSIS — Z981 Arthrodesis status: Secondary | ICD-10-CM | POA: Diagnosis not present

## 2017-10-25 DIAGNOSIS — M19031 Primary osteoarthritis, right wrist: Secondary | ICD-10-CM | POA: Diagnosis not present

## 2017-10-25 DIAGNOSIS — Z4789 Encounter for other orthopedic aftercare: Secondary | ICD-10-CM | POA: Diagnosis not present

## 2017-10-25 DIAGNOSIS — R200 Anesthesia of skin: Secondary | ICD-10-CM | POA: Diagnosis not present

## 2017-10-25 DIAGNOSIS — R202 Paresthesia of skin: Secondary | ICD-10-CM | POA: Diagnosis not present

## 2017-10-25 DIAGNOSIS — Z981 Arthrodesis status: Secondary | ICD-10-CM | POA: Diagnosis not present

## 2017-10-31 DIAGNOSIS — Z981 Arthrodesis status: Secondary | ICD-10-CM | POA: Diagnosis not present

## 2017-10-31 DIAGNOSIS — R200 Anesthesia of skin: Secondary | ICD-10-CM | POA: Diagnosis not present

## 2017-10-31 DIAGNOSIS — Z4789 Encounter for other orthopedic aftercare: Secondary | ICD-10-CM | POA: Diagnosis not present

## 2017-10-31 DIAGNOSIS — R202 Paresthesia of skin: Secondary | ICD-10-CM | POA: Diagnosis not present

## 2017-10-31 DIAGNOSIS — M19031 Primary osteoarthritis, right wrist: Secondary | ICD-10-CM | POA: Diagnosis not present

## 2017-11-01 ENCOUNTER — Ambulatory Visit: Payer: BLUE CROSS/BLUE SHIELD | Admitting: Obstetrics and Gynecology

## 2017-11-07 DIAGNOSIS — R200 Anesthesia of skin: Secondary | ICD-10-CM | POA: Diagnosis not present

## 2017-11-07 DIAGNOSIS — Z4789 Encounter for other orthopedic aftercare: Secondary | ICD-10-CM | POA: Diagnosis not present

## 2017-11-07 DIAGNOSIS — Z981 Arthrodesis status: Secondary | ICD-10-CM | POA: Diagnosis not present

## 2017-11-07 DIAGNOSIS — M19031 Primary osteoarthritis, right wrist: Secondary | ICD-10-CM | POA: Diagnosis not present

## 2017-11-07 DIAGNOSIS — R202 Paresthesia of skin: Secondary | ICD-10-CM | POA: Diagnosis not present

## 2017-11-11 DIAGNOSIS — Z79899 Other long term (current) drug therapy: Secondary | ICD-10-CM | POA: Diagnosis not present

## 2017-11-11 DIAGNOSIS — M069 Rheumatoid arthritis, unspecified: Secondary | ICD-10-CM | POA: Diagnosis not present

## 2017-11-11 DIAGNOSIS — Z6839 Body mass index (BMI) 39.0-39.9, adult: Secondary | ICD-10-CM | POA: Diagnosis not present

## 2017-11-11 DIAGNOSIS — Z7952 Long term (current) use of systemic steroids: Secondary | ICD-10-CM | POA: Diagnosis not present

## 2017-11-11 DIAGNOSIS — Z21 Asymptomatic human immunodeficiency virus [HIV] infection status: Secondary | ICD-10-CM | POA: Diagnosis not present

## 2017-11-14 DIAGNOSIS — R200 Anesthesia of skin: Secondary | ICD-10-CM | POA: Diagnosis not present

## 2017-11-14 DIAGNOSIS — Z981 Arthrodesis status: Secondary | ICD-10-CM | POA: Diagnosis not present

## 2017-11-14 DIAGNOSIS — R202 Paresthesia of skin: Secondary | ICD-10-CM | POA: Diagnosis not present

## 2017-11-14 DIAGNOSIS — Z4789 Encounter for other orthopedic aftercare: Secondary | ICD-10-CM | POA: Diagnosis not present

## 2017-11-14 DIAGNOSIS — M19031 Primary osteoarthritis, right wrist: Secondary | ICD-10-CM | POA: Diagnosis not present

## 2017-11-28 DIAGNOSIS — R200 Anesthesia of skin: Secondary | ICD-10-CM | POA: Diagnosis not present

## 2017-11-28 DIAGNOSIS — R202 Paresthesia of skin: Secondary | ICD-10-CM | POA: Diagnosis not present

## 2017-11-28 DIAGNOSIS — Z981 Arthrodesis status: Secondary | ICD-10-CM | POA: Diagnosis not present

## 2017-11-28 DIAGNOSIS — Z4789 Encounter for other orthopedic aftercare: Secondary | ICD-10-CM | POA: Diagnosis not present

## 2017-11-28 DIAGNOSIS — M19031 Primary osteoarthritis, right wrist: Secondary | ICD-10-CM | POA: Diagnosis not present

## 2017-12-05 DIAGNOSIS — Z981 Arthrodesis status: Secondary | ICD-10-CM | POA: Diagnosis not present

## 2017-12-05 DIAGNOSIS — R202 Paresthesia of skin: Secondary | ICD-10-CM | POA: Diagnosis not present

## 2017-12-05 DIAGNOSIS — R200 Anesthesia of skin: Secondary | ICD-10-CM | POA: Diagnosis not present

## 2017-12-05 DIAGNOSIS — M19031 Primary osteoarthritis, right wrist: Secondary | ICD-10-CM | POA: Diagnosis not present

## 2017-12-05 DIAGNOSIS — Z4789 Encounter for other orthopedic aftercare: Secondary | ICD-10-CM | POA: Diagnosis not present

## 2017-12-30 DIAGNOSIS — M19031 Primary osteoarthritis, right wrist: Secondary | ICD-10-CM | POA: Diagnosis not present

## 2017-12-31 DIAGNOSIS — M19031 Primary osteoarthritis, right wrist: Secondary | ICD-10-CM | POA: Diagnosis not present

## 2017-12-31 DIAGNOSIS — R202 Paresthesia of skin: Secondary | ICD-10-CM | POA: Diagnosis not present

## 2017-12-31 DIAGNOSIS — R200 Anesthesia of skin: Secondary | ICD-10-CM | POA: Diagnosis not present

## 2017-12-31 DIAGNOSIS — Z4789 Encounter for other orthopedic aftercare: Secondary | ICD-10-CM | POA: Diagnosis not present

## 2017-12-31 DIAGNOSIS — Z981 Arthrodesis status: Secondary | ICD-10-CM | POA: Diagnosis not present

## 2018-01-08 ENCOUNTER — Encounter: Payer: Self-pay | Admitting: Podiatry

## 2018-01-08 ENCOUNTER — Ambulatory Visit (INDEPENDENT_AMBULATORY_CARE_PROVIDER_SITE_OTHER): Payer: BLUE CROSS/BLUE SHIELD

## 2018-01-08 ENCOUNTER — Ambulatory Visit (INDEPENDENT_AMBULATORY_CARE_PROVIDER_SITE_OTHER): Payer: BLUE CROSS/BLUE SHIELD | Admitting: Podiatry

## 2018-01-08 DIAGNOSIS — S90851A Superficial foreign body, right foot, initial encounter: Secondary | ICD-10-CM

## 2018-01-08 NOTE — Progress Notes (Signed)
She presents today states that she has a piece of glass in her foot that happened about 3 days ago she refers to her right heel.  She states that she was taking the trash out Sunday and stepped on some glass has been trying to pick it out ever since and states that is so painful she can do it alone.  Objective: Vital signs stable she is alert and oriented x3.  She has a small puncture wound to the right heel.  Radiographs do demonstrate a foreign body.  It appears to be a small piece of glass.  Pulses are palpable.  After Betadine skin prep I was able to gently debride away some of the skin until I could reach a large piece of glass.  Using forceps I then remove the glass from the wound.  The area was copiously lavaged with alcohol and Betadine was placed.  A dresser compressive dressing was applied applied.  Assessment: Foreign body glass right heel.  Plan: Removal of the foreign body subcutaneous tissue right.  I will follow-up with her in a couple weeks make sure she is doing well.  She will cover with small amount of Neosporin and a Band-Aid for the next couple of days.

## 2018-04-14 DIAGNOSIS — Z21 Asymptomatic human immunodeficiency virus [HIV] infection status: Secondary | ICD-10-CM | POA: Diagnosis not present

## 2018-04-14 DIAGNOSIS — M25562 Pain in left knee: Secondary | ICD-10-CM | POA: Diagnosis not present

## 2018-04-14 DIAGNOSIS — Z6841 Body Mass Index (BMI) 40.0 and over, adult: Secondary | ICD-10-CM | POA: Diagnosis not present

## 2018-04-14 DIAGNOSIS — M1712 Unilateral primary osteoarthritis, left knee: Secondary | ICD-10-CM | POA: Diagnosis not present

## 2018-04-14 DIAGNOSIS — M1711 Unilateral primary osteoarthritis, right knee: Secondary | ICD-10-CM | POA: Diagnosis not present

## 2018-04-14 DIAGNOSIS — M25561 Pain in right knee: Secondary | ICD-10-CM | POA: Diagnosis not present

## 2018-04-14 DIAGNOSIS — M25579 Pain in unspecified ankle and joints of unspecified foot: Secondary | ICD-10-CM | POA: Diagnosis not present

## 2018-04-14 DIAGNOSIS — Z881 Allergy status to other antibiotic agents status: Secondary | ICD-10-CM | POA: Diagnosis not present

## 2018-04-14 DIAGNOSIS — M069 Rheumatoid arthritis, unspecified: Secondary | ICD-10-CM | POA: Diagnosis not present

## 2018-05-06 DIAGNOSIS — Z21 Asymptomatic human immunodeficiency virus [HIV] infection status: Secondary | ICD-10-CM | POA: Diagnosis not present

## 2018-05-06 DIAGNOSIS — Z87891 Personal history of nicotine dependence: Secondary | ICD-10-CM | POA: Diagnosis not present

## 2018-05-06 DIAGNOSIS — Z885 Allergy status to narcotic agent status: Secondary | ICD-10-CM | POA: Diagnosis not present

## 2018-05-06 DIAGNOSIS — Z1211 Encounter for screening for malignant neoplasm of colon: Secondary | ICD-10-CM | POA: Diagnosis not present

## 2018-05-06 DIAGNOSIS — K5730 Diverticulosis of large intestine without perforation or abscess without bleeding: Secondary | ICD-10-CM | POA: Diagnosis not present

## 2018-05-06 DIAGNOSIS — Z79899 Other long term (current) drug therapy: Secondary | ICD-10-CM | POA: Diagnosis not present

## 2018-05-06 DIAGNOSIS — M069 Rheumatoid arthritis, unspecified: Secondary | ICD-10-CM | POA: Diagnosis not present

## 2018-07-29 DIAGNOSIS — Z1231 Encounter for screening mammogram for malignant neoplasm of breast: Secondary | ICD-10-CM | POA: Diagnosis not present

## 2018-07-29 DIAGNOSIS — Z1239 Encounter for other screening for malignant neoplasm of breast: Secondary | ICD-10-CM | POA: Diagnosis not present

## 2018-10-06 DIAGNOSIS — M0609 Rheumatoid arthritis without rheumatoid factor, multiple sites: Secondary | ICD-10-CM | POA: Diagnosis not present

## 2018-10-06 DIAGNOSIS — M25571 Pain in right ankle and joints of right foot: Secondary | ICD-10-CM | POA: Diagnosis not present

## 2018-10-06 DIAGNOSIS — D649 Anemia, unspecified: Secondary | ICD-10-CM | POA: Diagnosis not present

## 2018-10-06 DIAGNOSIS — M79661 Pain in right lower leg: Secondary | ICD-10-CM | POA: Diagnosis not present

## 2018-10-06 DIAGNOSIS — Z21 Asymptomatic human immunodeficiency virus [HIV] infection status: Secondary | ICD-10-CM | POA: Diagnosis not present

## 2018-10-06 DIAGNOSIS — M0600 Rheumatoid arthritis without rheumatoid factor, unspecified site: Secondary | ICD-10-CM | POA: Diagnosis not present

## 2018-10-06 DIAGNOSIS — E669 Obesity, unspecified: Secondary | ICD-10-CM | POA: Diagnosis not present

## 2018-10-06 DIAGNOSIS — X58XXXA Exposure to other specified factors, initial encounter: Secondary | ICD-10-CM | POA: Diagnosis not present

## 2018-10-06 DIAGNOSIS — M25539 Pain in unspecified wrist: Secondary | ICD-10-CM | POA: Diagnosis not present

## 2018-10-06 DIAGNOSIS — R7982 Elevated C-reactive protein (CRP): Secondary | ICD-10-CM | POA: Diagnosis not present

## 2018-10-06 DIAGNOSIS — M25561 Pain in right knee: Secondary | ICD-10-CM | POA: Diagnosis not present

## 2018-10-06 DIAGNOSIS — M1710 Unilateral primary osteoarthritis, unspecified knee: Secondary | ICD-10-CM | POA: Diagnosis not present

## 2018-10-07 DIAGNOSIS — Z6839 Body mass index (BMI) 39.0-39.9, adult: Secondary | ICD-10-CM | POA: Diagnosis not present

## 2018-10-07 DIAGNOSIS — J0100 Acute maxillary sinusitis, unspecified: Secondary | ICD-10-CM | POA: Diagnosis not present

## 2018-10-07 DIAGNOSIS — B20 Human immunodeficiency virus [HIV] disease: Secondary | ICD-10-CM | POA: Diagnosis not present

## 2018-10-07 DIAGNOSIS — R21 Rash and other nonspecific skin eruption: Secondary | ICD-10-CM | POA: Diagnosis not present

## 2018-12-01 DIAGNOSIS — L308 Other specified dermatitis: Secondary | ICD-10-CM | POA: Diagnosis not present

## 2018-12-30 DIAGNOSIS — L308 Other specified dermatitis: Secondary | ICD-10-CM | POA: Diagnosis not present

## 2018-12-30 DIAGNOSIS — R21 Rash and other nonspecific skin eruption: Secondary | ICD-10-CM | POA: Diagnosis not present

## 2018-12-30 DIAGNOSIS — L304 Erythema intertrigo: Secondary | ICD-10-CM | POA: Diagnosis not present

## 2019-03-09 DIAGNOSIS — R070 Pain in throat: Secondary | ICD-10-CM | POA: Diagnosis not present

## 2019-03-09 DIAGNOSIS — K219 Gastro-esophageal reflux disease without esophagitis: Secondary | ICD-10-CM | POA: Diagnosis not present

## 2019-03-23 DIAGNOSIS — R7989 Other specified abnormal findings of blood chemistry: Secondary | ICD-10-CM | POA: Diagnosis not present

## 2019-03-23 DIAGNOSIS — D899 Disorder involving the immune mechanism, unspecified: Secondary | ICD-10-CM | POA: Diagnosis not present

## 2019-03-23 DIAGNOSIS — B20 Human immunodeficiency virus [HIV] disease: Secondary | ICD-10-CM | POA: Diagnosis not present

## 2019-03-23 DIAGNOSIS — M542 Cervicalgia: Secondary | ICD-10-CM | POA: Diagnosis not present

## 2019-03-30 DIAGNOSIS — M542 Cervicalgia: Secondary | ICD-10-CM | POA: Diagnosis not present

## 2019-03-30 DIAGNOSIS — R221 Localized swelling, mass and lump, neck: Secondary | ICD-10-CM | POA: Diagnosis not present

## 2019-10-18 IMAGING — CT CT RENAL STONE PROTOCOL
2 of 4 series · 16 of 46 positions shown, 18 images · non-contrast
Comparison: Abdominal CT dated 05/19/2013

CLINICAL DATA: 51-year-old female with abdominal pain. Sudden onset
left-sided pain.

EXAM:
CT ABDOMEN AND PELVIS WITHOUT CONTRAST
TECHNIQUE: Multidetector CT imaging of the abdomen and pelvis was performed
following the standard protocol without IV contrast.

[Series 2: stone full standard · axial · 0.63mm/px · z∈[-1084,-650]mm · 13 of 95 slices shown, 15 images]
[im 4/95  soft-tissue]
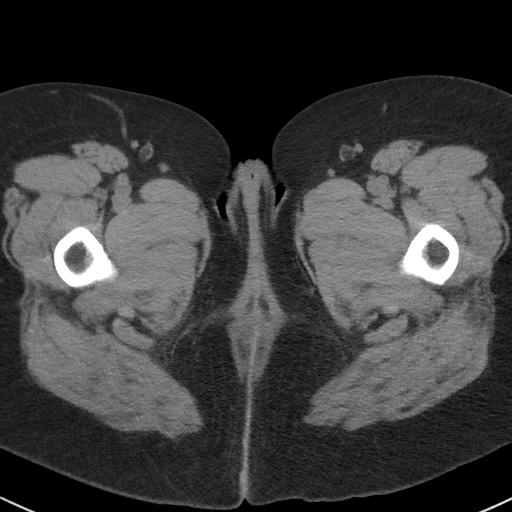
[im 4/95  bone]
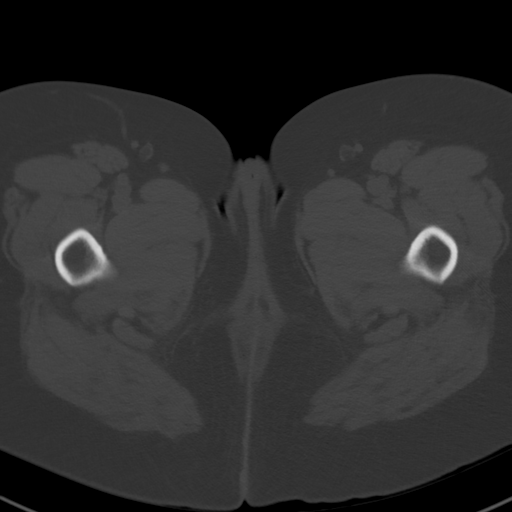
[im 11/95  soft-tissue]
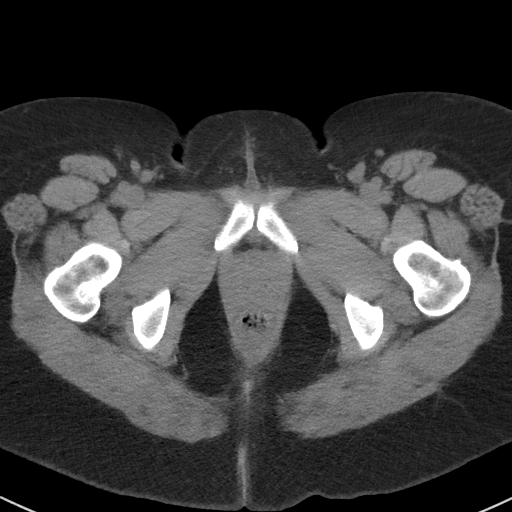
[im 19/95  soft-tissue]
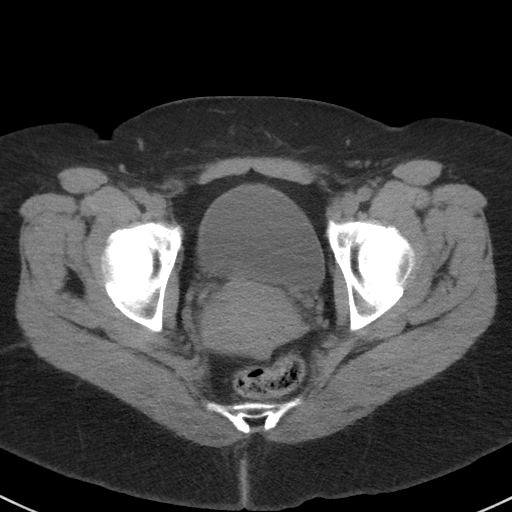
[im 26/95  soft-tissue]
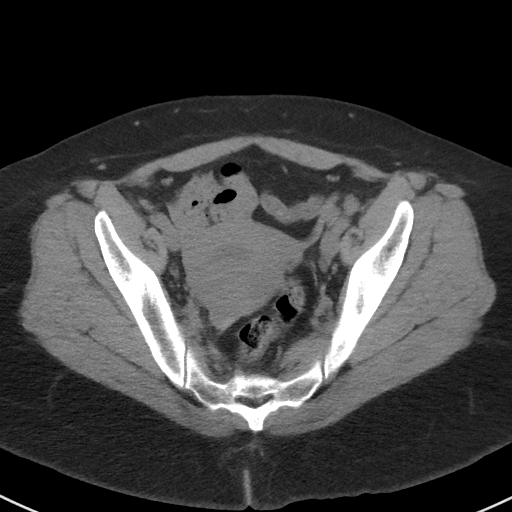
[im 33/95  soft-tissue]
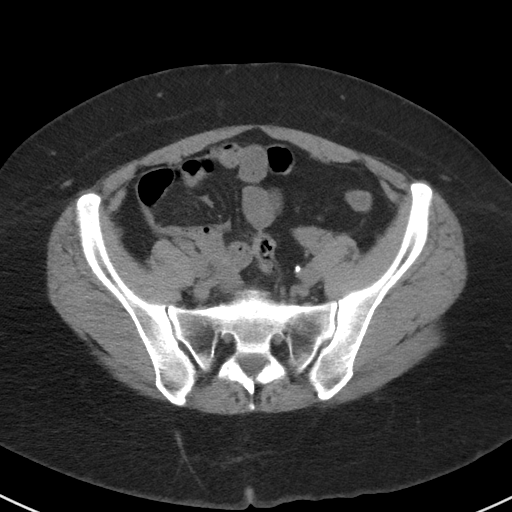
[im 40/95  soft-tissue]
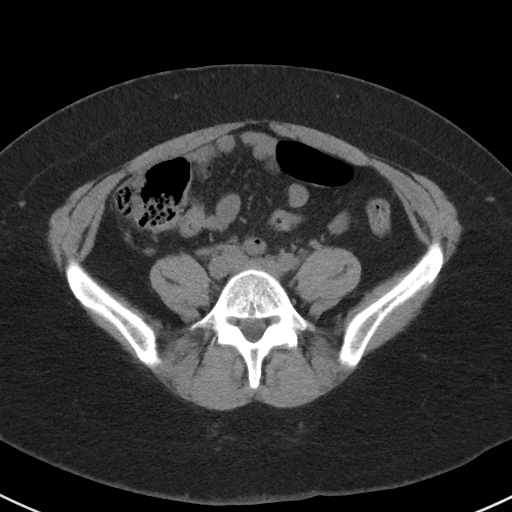
[im 48/95  soft-tissue]
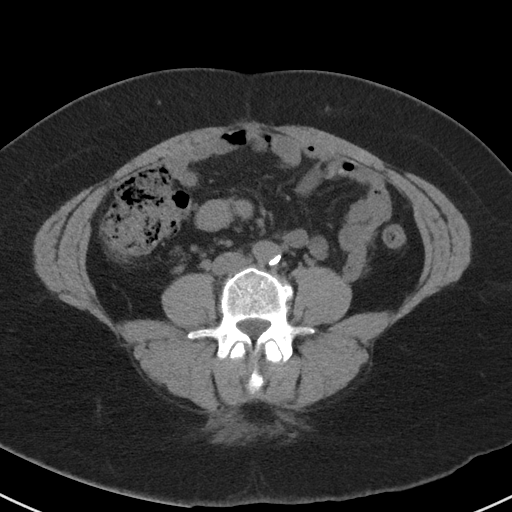
[im 55/95  soft-tissue]
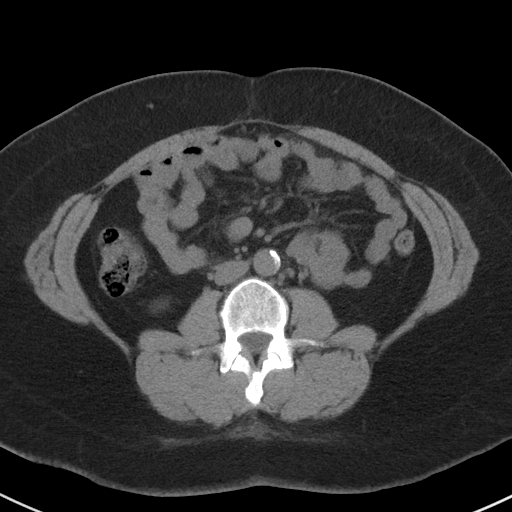
[im 62/95  soft-tissue]
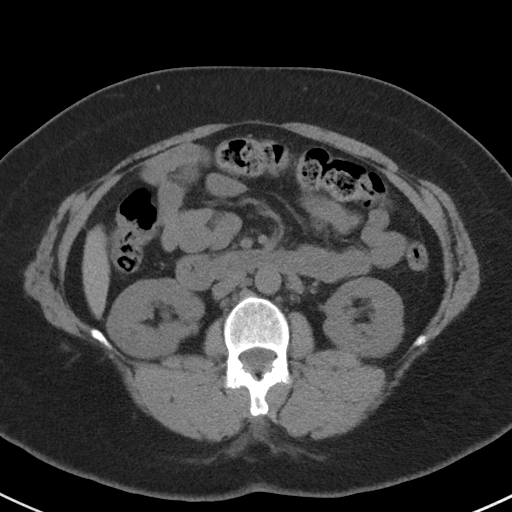
[im 62/95  bone]
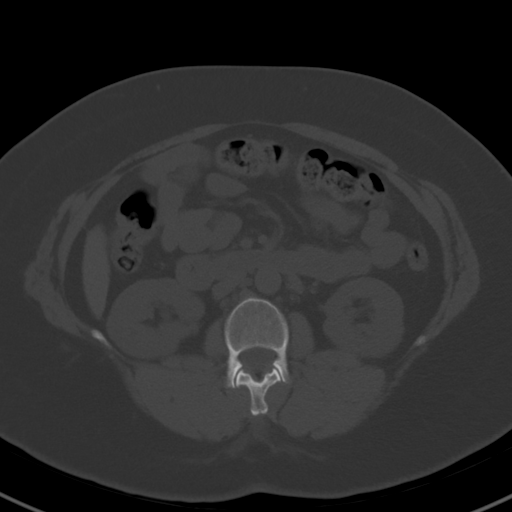
[im 69/95  soft-tissue]
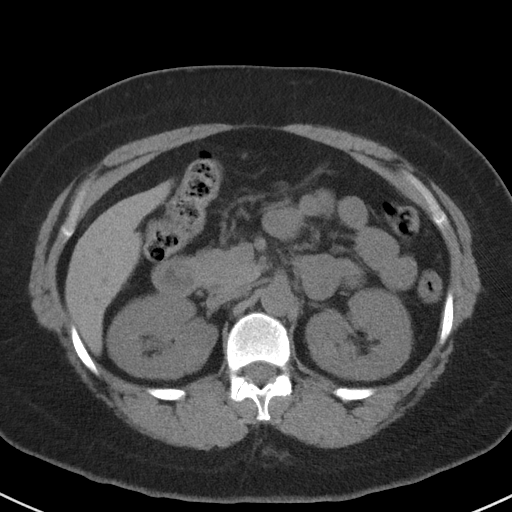
[im 76/95  soft-tissue]
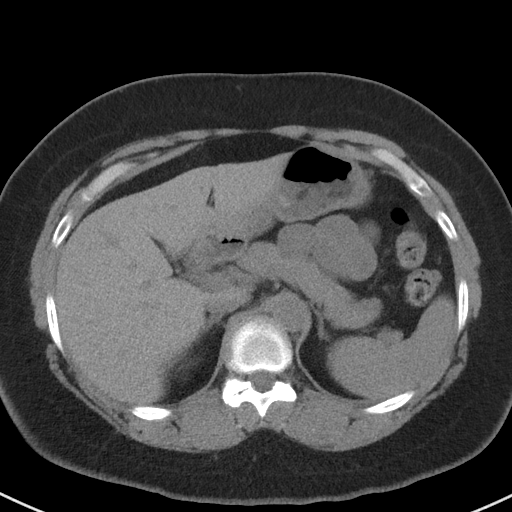
[im 84/95  soft-tissue]
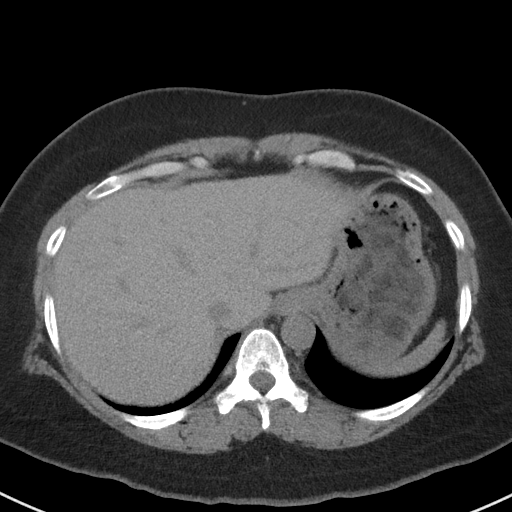
[im 91/95  soft-tissue]
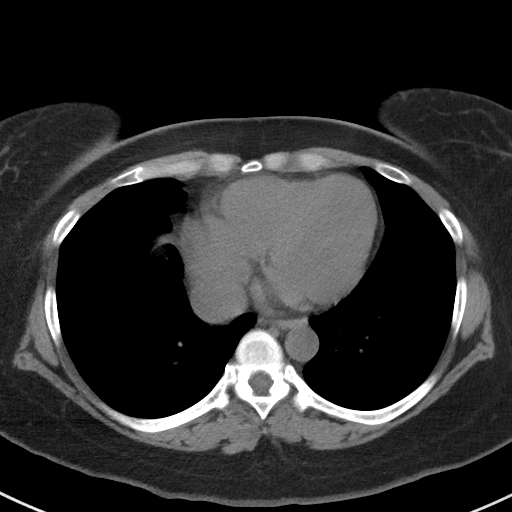

[Series 5: coronal · coronal · 0.88mm/px · 3 of 141 slices shown]
[im 47/141  soft-tissue]
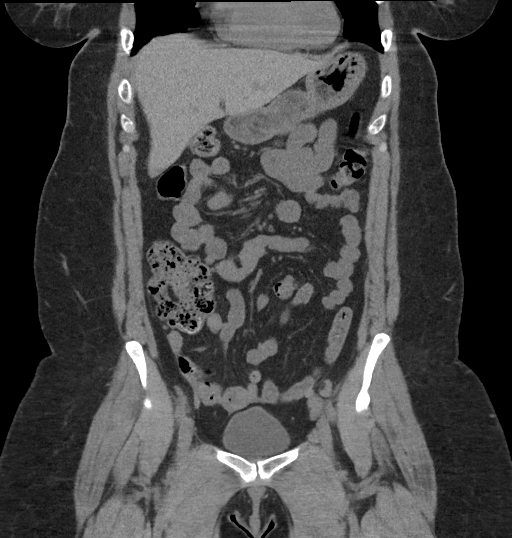
[im 63/141  soft-tissue]
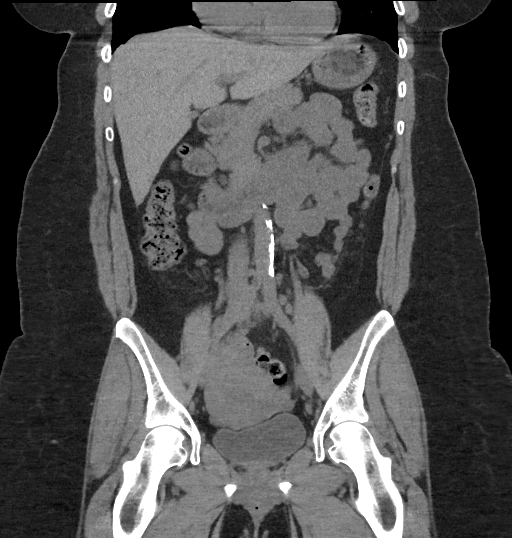
[im 78/141  soft-tissue]
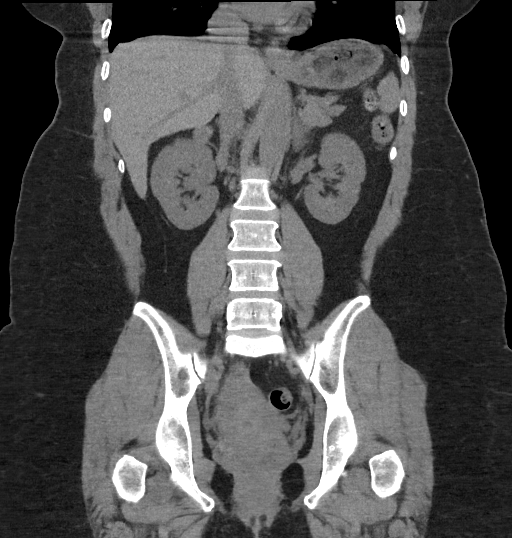

[16 of 46 positions shown; findings below may reference images not displayed]

FINDINGS: Evaluation of this exam is limited in the absence of intravenous
contrast.

Lower chest: The visualized lung bases are clear. There is
hypoattenuation of the cardiac blood pool suggestive of a degree of
anemia. Clinical correlation is recommended.

No intra-abdominal free air or free fluid.

Hepatobiliary: The liver is unremarkable. The gallbladder is
contracted.

Pancreas: Unremarkable. No pancreatic ductal dilatation or
surrounding inflammatory changes.

Spleen: Normal in size without focal abnormality.

Adrenals/Urinary Tract: Adrenal glands are unremarkable. Kidneys are
normal, without renal calculi, focal lesion, or hydronephrosis.
Bladder is unremarkable.

Stomach/Bowel: Stomach is within normal limits. Appendix appears
normal. No evidence of bowel wall thickening, distention, or
inflammatory changes.

Vascular/Lymphatic: Mild atherosclerotic calcification of the aorta.
The abdominal aorta and IVC are otherwise grossly unremarkable on
this noncontrast CT. No portal venous gas. There is no adenopathy.

Reproductive: The uterus is anteverted. Faint 12 mm low attenuating
lesion in the posterior myometrium (sagittal series 6, image 117) is
not well characterized but may represent a fibroid. The ovaries are
grossly unremarkable as visualized.

Other: Small fat containing umbilical hernia.

Musculoskeletal: No acute or significant osseous findings.
IMPRESSION: 1. No acute intra-abdominal or pelvic pathology. No bowel
obstruction or active inflammation. Normal appendix.
2. No hydronephrosis or nephrolithiasis.
3.  Aortic Atherosclerosis ([id number]).

## 2019-11-30 ENCOUNTER — Other Ambulatory Visit: Payer: Self-pay

## 2019-11-30 ENCOUNTER — Ambulatory Visit: Payer: Self-pay | Attending: Internal Medicine

## 2019-11-30 DIAGNOSIS — Z23 Encounter for immunization: Secondary | ICD-10-CM

## 2019-11-30 NOTE — Progress Notes (Signed)
   Covid-19 Vaccination Clinic  Name:  LAKENA SPARLIN    MRN: [id number] DOB: 09/05/1965  11/30/2019  Ms. Membreno was observed post Covid-19 immunization for 15 minutes without incident. She was provided with Vaccine Information Sheet and instruction to access the V-Safe system.   Ms. Eid was instructed to call 911 with any severe reactions post vaccine: Marland Kitchen Difficulty breathing  . Swelling of face and throat  . A fast heartbeat  . A bad rash all over body  . Dizziness and weakness   Immunizations Administered    Name Date Dose VIS Date Route   Pfizer COVID-19 Vaccine 11/30/2019  9:02 AM 0.3 mL 08/14/2019 Intramuscular   Manufacturer: [employer], [employer]   Lot: [id number]   NDC: [address]-4

## 2019-12-29 ENCOUNTER — Ambulatory Visit: Payer: Self-pay | Attending: Internal Medicine

## 2019-12-29 DIAGNOSIS — Z23 Encounter for immunization: Secondary | ICD-10-CM

## 2019-12-29 NOTE — Progress Notes (Signed)
   Covid-19 Vaccination Clinic  Name:  Alexandra Vaughan    MRN: [id number] DOB: 1966-04-13  12/29/2019  [name] was observed post Covid-19 immunization for 15 minutes without incident. She was provided with Vaccine Information Sheet and instruction to access the V-Safe system.   [name] was instructed to call 911 with any severe reactions post vaccine: Marland Kitchen Difficulty breathing  . Swelling of face and throat  . A fast heartbeat  . A bad rash all over body  . Dizziness and weakness   Immunizations Administered    Name Date Dose VIS Date Route   Pfizer COVID-19 Vaccine 12/29/2019  9:07 AM 0.3 mL 10/28/2018 Intramuscular   Manufacturer: [employer], [employer]   Lot: [id number]   NDC: [address]-6

## 2020-09-20 ENCOUNTER — Other Ambulatory Visit (HOSPITAL_COMMUNITY): Payer: Self-pay

## 2022-02-28 ENCOUNTER — Ambulatory Visit: Payer: BC Managed Care – PPO | Admitting: Podiatry

## 2022-02-28 ENCOUNTER — Encounter: Payer: Self-pay | Admitting: Podiatry

## 2022-02-28 DIAGNOSIS — L600 Ingrowing nail: Secondary | ICD-10-CM

## 2022-02-28 MED ORDER — NEOMYCIN-POLYMYXIN-HC 1 % OT SOLN: OTIC | 1 refills | Status: AC

## 2022-02-28 NOTE — Progress Notes (Signed)
Subjective:  Patient ID: Alexandra Vaughan, female    DOB: 1966-01-08,  MRN: [id number] HPI Chief Complaint  Patient presents with   Toe Pain    Hallux left - lateral border, ingrown toenail, tender x several weeks, tried clipping out, hallux right sometimes sore   New Patient (Initial Visit)    Est pt 2019    56 y.o. female presents with the above complaint.   ROS: Denies fever chills nausea vomiting muscle aches pains calf pain back pain chest pain shortness of breath.  Past Medical History:  Diagnosis Date   HIV (human immunodeficiency virus infection) (HCC)    Past Surgical History:  Procedure Laterality Date   JOINT REPLACEMENT  08/06/2017    Current Outpatient Medications:    NEOMYCIN-POLYMYXIN-HYDROCORTISONE (CORTISPORIN) 1 % SOLN OTIC solution, Apply 1-2 drops to toe BID after soaking, Disp: 10 mL, Rfl: 1   Acetaminophen (MAPAP) 500 MG coapsule, Take by mouth., Disp: , Rfl:    Adalimumab 40 MG/0.8ML PNKT, Inject 40 mg into the skin., Disp: , Rfl:    atazanavir (REYATAZ) 300 MG capsule, Take 300 mg by mouth., Disp: , Rfl:    atorvastatin (LIPITOR) 10 MG tablet, Take 10 mg by mouth daily., Disp: , Rfl:    Calcium Carb-Cholecalciferol 600-800 MG-UNIT CHEW, Chew by mouth., Disp: , Rfl:    cetirizine (ZYRTEC) 10 MG tablet, Take 10 mg by mouth at bedtime., Disp: , Rfl:    clindamycin (CLEOCIN T) 1 % external solution, Apply topically daily., Disp: , Rfl:    darunavir (PREZISTA) 800 MG tablet, Take 800 mg by mouth., Disp: , Rfl:    DULoxetine (CYMBALTA) 30 MG capsule, Take 30 mg by mouth daily., Disp: , Rfl:    emtricitabine-tenofovir (TRUVADA) 200-300 MG per tablet, Take by mouth., Disp: , Rfl:    emtricitabine-tenofovir AF (DESCOVY) 200-25 MG tablet, Take by mouth., Disp: , Rfl:    etanercept (ENBREL) 50 MG/ML injection, Inject 50 mg into the skin., Disp: , Rfl:    Ferrous Fumarate (FERROCITE) 324 (106 Fe) MG TABS tablet, TAKE 1 TABLET BY MOUTH EVERY DAY AT 6:00AM, Disp: ,  Rfl:    gabapentin (NEURONTIN) 300 MG capsule, SMARTSIG:600 Milligram(s) By Mouth Daily, Disp: , Rfl:    hydrochlorothiazide (HYDRODIURIL) 25 MG tablet, Take 25 mg by mouth daily., Disp: , Rfl:    hydroxychloroquine (PLAQUENIL) 200 MG tablet, TAKE 1 TABLET BY MOUTH TWICE A DAY, Disp: , Rfl:    meloxicam (MOBIC) 15 MG tablet, Take 15 mg by mouth daily., Disp: , Rfl:    predniSONE (DELTASONE) 10 MG tablet, Take 10 mg by mouth., Disp: , Rfl:    ritonavir (NORVIR) 100 MG TABS tablet, Take 100 mg by mouth., Disp: , Rfl:    TIVICAY 50 MG tablet, Take 50 mg by mouth daily., Disp: , Rfl:    triamcinolone cream (KENALOG) 0.1 %, APPLY TO AFFECTED AREA TWICE A DAY, Disp: , Rfl: 1   vitamin C (ASCORBIC ACID) 500 MG tablet, Take by mouth., Disp: , Rfl:    XELJANZ 5 MG TABS, Take 1 tablet by mouth 2 (two) times daily., Disp: , Rfl:   Allergies  Allergen Reactions   Percocet [Oxycodone-Acetaminophen] Nausea Only   Ciprofloxacin Palpitations   Review of Systems Objective:  There were no vitals filed for this visit.  General: Well developed, nourished, in no acute distress, alert and oriented x3   Dermatological: Skin is warm, dry and supple bilateral. Nails x 10 are well maintained; remaining integument appears  unremarkable at this time. There are no open sores, no preulcerative lesions, no rash or signs of infection present.  Sharp incurvated nail margin tibial fibular border of the hallux bilaterally.  There is no signs of infection at this point.  Vascular: Dorsalis Pedis artery and Posterior Tibial artery pedal pulses are 2/4 bilateral with immedate capillary fill time. Pedal hair growth present. No varicosities and no lower extremity edema present bilateral.   Neruologic: Grossly intact via light touch bilateral. Vibratory intact via tuning fork bilateral. Protective threshold with Semmes Wienstein monofilament intact to all pedal sites bilateral. Patellar and Achilles deep tendon reflexes 2+  bilateral. No Babinski or clonus noted bilateral.   Musculoskeletal: No gross boney pedal deformities bilateral. No pain, crepitus, or limitation noted with foot and ankle range of motion bilateral. Muscular strength 5/5 in all groups tested bilateral.  Gait: Unassisted, Nonantalgic.    Radiographs:  None taken  Assessment & Plan:   Assessment: Ingrown toenails tibial-fibular border the hallux bilateral.  Plan: Discussed etiology pathology conservative versus surgical therapies at this point I recommended chemical matricectomy to the tibial fibular border the hallux bilateral.  This was performed today after local anesthetic was administered to each great toe.  The margins along the tibial and fibular border were resected phenol was applied 30 seconds each x3 and then neutralized with isopropyl alcohol.  Silvadene cream Telfa pad and a dry sterile compressive dressing was applied.  She was given both oral and written home-going instruction for the care and soaking of the toe as well as a prescription of Cortisporin Otic to be applied twice daily after soaking.  Follow-up with her in 2 to 3 weeks.     Sanay [place], [place]

## 2022-02-28 NOTE — Patient Instructions (Signed)

## 2022-03-28 ENCOUNTER — Encounter: Payer: Self-pay | Admitting: Podiatry

## 2022-03-28 ENCOUNTER — Ambulatory Visit: Payer: BC Managed Care – PPO | Admitting: Podiatry

## 2022-03-28 DIAGNOSIS — L600 Ingrowing nail: Secondary | ICD-10-CM

## 2022-03-28 DIAGNOSIS — Z9889 Other specified postprocedural states: Secondary | ICD-10-CM

## 2022-03-28 NOTE — Progress Notes (Signed)
She presents today for chief follow-up of her toenail she states that they are doing fine no problems whatsoever he does like me to take a look at them again.  Objective: There is no erythema edema salines drainage odor appear to be healing very nicely.  Assessment: Well-healing surgical toes.  Plan: Follow-up with me as needed.
